# Patient Record
Sex: Male | Born: 2000 | Hispanic: Yes | Marital: Single | Smoking: Never smoker
Health system: Southern US, Community
[De-identification: ages and names within clinical notes are randomized; demographics above are authoritative.]

## PROBLEM LIST (undated history)

## (undated) DIAGNOSIS — Z8669 Personal history of other diseases of the nervous system and sense organs: Secondary | ICD-10-CM

## (undated) HISTORY — PX: MYRINGOTOMY WITH TUBE PLACEMENT: SHX5663

---
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
## 2021-02-04 IMAGING — DX DG FINGER INDEX 2+V*R*
3 series · 3 of 3 positions shown · non-contrast
Comparison: None.

CLINICAL DATA: Crush injury right index finger injury

EXAM:
RIGHT INDEX FINGER 2+V

[finger ap]
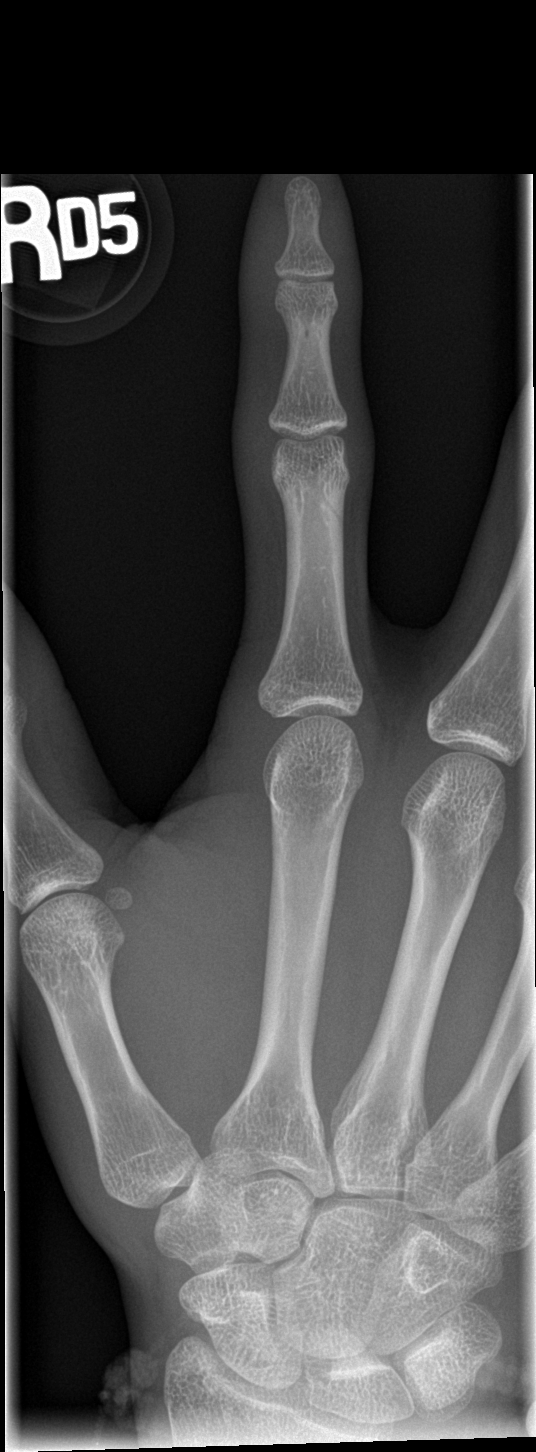

[finger obl]
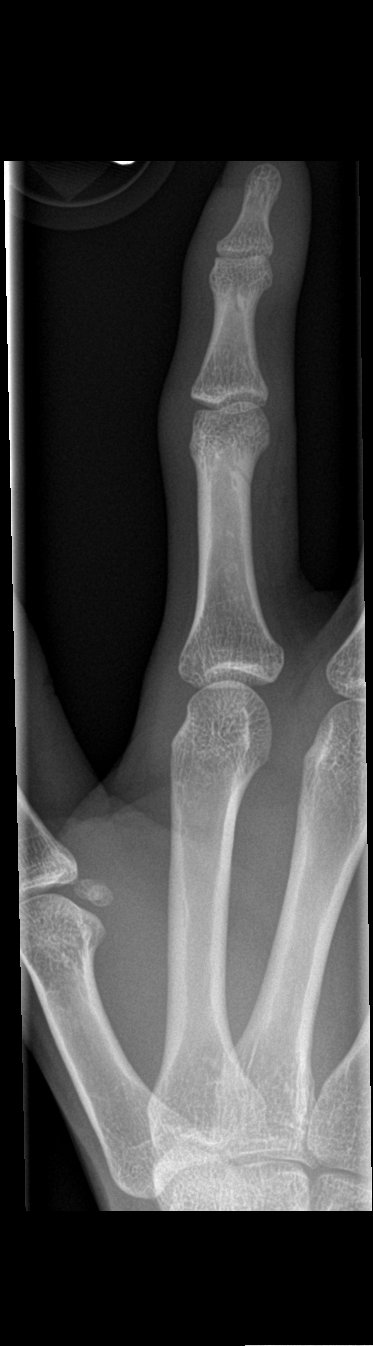

[finger lat]
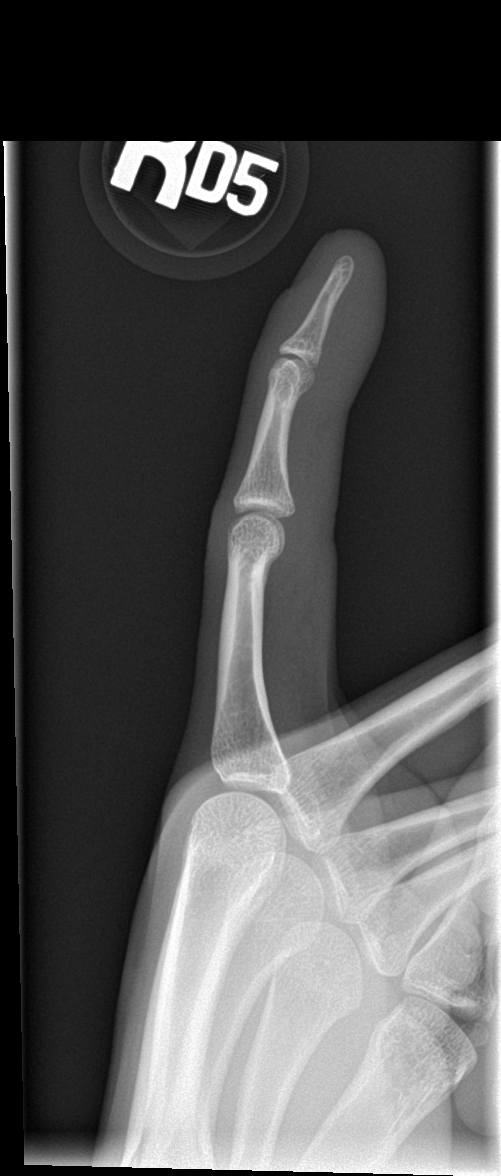

[3 of 3 positions shown; findings below may reference images not displayed]

FINDINGS: There is no evidence of fracture or dislocation. There is no
evidence of arthropathy or other focal bone abnormality. Soft
tissues are unremarkable.
IMPRESSION: Negative.

## 2022-03-13 ENCOUNTER — Encounter (HOSPITAL_COMMUNITY): Admission: EM | Disposition: A | Discharge status: Home / Self Care | Payer: Self-pay | Source: Home / Self Care

## 2022-03-13 ENCOUNTER — Inpatient Hospital Stay (HOSPITAL_COMMUNITY): Payer: Medicaid Other | Admitting: Anesthesiology

## 2022-03-13 ENCOUNTER — Emergency Department (HOSPITAL_COMMUNITY): Payer: Medicaid Other

## 2022-03-13 ENCOUNTER — Encounter (HOSPITAL_COMMUNITY): Payer: Self-pay | Admitting: General Surgery

## 2022-03-13 ENCOUNTER — Inpatient Hospital Stay (HOSPITAL_COMMUNITY): Payer: Medicaid Other

## 2022-03-13 ENCOUNTER — Other Ambulatory Visit: Payer: Self-pay

## 2022-03-13 ENCOUNTER — Inpatient Hospital Stay (HOSPITAL_COMMUNITY)
Admission: EM | Admit: 2022-03-13 | Discharge: 2022-03-16 | DRG: 481 | Disposition: A | Discharge status: Home / Self Care | Payer: Medicaid Other | Attending: Surgery | Admitting: Surgery

## 2022-03-13 DIAGNOSIS — S72341A Displaced spiral fracture of shaft of right femur, initial encounter for closed fracture: Principal | ICD-10-CM | POA: Diagnosis present

## 2022-03-13 DIAGNOSIS — M4854XA Collapsed vertebra, not elsewhere classified, thoracic region, initial encounter for fracture: Secondary | ICD-10-CM | POA: Diagnosis present

## 2022-03-13 DIAGNOSIS — Y92410 Unspecified street and highway as the place of occurrence of the external cause: Secondary | ICD-10-CM | POA: Diagnosis not present

## 2022-03-13 DIAGNOSIS — Z880 Allergy status to penicillin: Secondary | ICD-10-CM | POA: Diagnosis not present

## 2022-03-13 DIAGNOSIS — T1490XA Injury, unspecified, initial encounter: Secondary | ICD-10-CM | POA: Diagnosis present

## 2022-03-13 DIAGNOSIS — S72301A Unspecified fracture of shaft of right femur, initial encounter for closed fracture: Secondary | ICD-10-CM

## 2022-03-13 DIAGNOSIS — M4856XA Collapsed vertebra, not elsewhere classified, lumbar region, initial encounter for fracture: Secondary | ICD-10-CM | POA: Diagnosis present

## 2022-03-13 HISTORY — DX: Personal history of other diseases of the nervous system and sense organs: Z86.69

## 2022-03-13 HISTORY — PX: FEMUR IM NAIL: SHX1597

## 2022-03-13 LAB — CBC
HCT: 44.3 % (ref 39.0–52.0)
HCT: 43.8 % (ref 39.0–52.0)
Hemoglobin: 15.4 g/dL (ref 13.0–17.0)
Hemoglobin: 15.2 g/dL (ref 13.0–17.0)
MCH: 31.8 pg (ref 26.0–34.0)
MCH: 31.7 pg (ref 26.0–34.0)
MCHC: 34.8 g/dL (ref 30.0–36.0)
MCHC: 34.7 g/dL (ref 30.0–36.0)
MCV: 91.5 fL (ref 80.0–100.0)
MCV: 91.3 fL (ref 80.0–100.0)
Platelets: 189 10*3/uL (ref 150–400)
Platelets: 168 10*3/uL (ref 150–400)
RBC: 4.84 MIL/uL (ref 4.22–5.81)
RBC: 4.8 MIL/uL (ref 4.22–5.81)
RDW: 12.2 % (ref 11.5–15.5)
RDW: 12.2 % (ref 11.5–15.5)
WBC: 11.1 10*3/uL — ABNORMAL HIGH (ref 4.0–10.5)
WBC: 14.1 10*3/uL — ABNORMAL HIGH (ref 4.0–10.5)
nRBC: 0 % (ref 0.0–0.2)
nRBC: 0 % (ref 0.0–0.2)

## 2022-03-13 LAB — I-STAT CHEM 8, ED
BUN: 23 mg/dL — ABNORMAL HIGH (ref 6–20)
Calcium, Ion: 1.15 mmol/L (ref 1.15–1.40)
Chloride: 105 mmol/L (ref 98–111)
Creatinine, Ser: 0.9 mg/dL (ref 0.61–1.24)
Glucose, Bld: 127 mg/dL — ABNORMAL HIGH (ref 70–99)
HCT: 44 % (ref 39.0–52.0)
Hemoglobin: 15 g/dL (ref 13.0–17.0)
Potassium: 4.4 mmol/L (ref 3.5–5.1)
Sodium: 139 mmol/L (ref 135–145)
TCO2: 26 mmol/L (ref 22–32)

## 2022-03-13 LAB — COMPREHENSIVE METABOLIC PANEL
ALT: 26 U/L (ref 0–44)
AST: 56 U/L — ABNORMAL HIGH (ref 15–41)
Albumin: 4.3 g/dL (ref 3.5–5.0)
Alkaline Phosphatase: 79 U/L (ref 38–126)
Anion gap: 9 (ref 5–15)
BUN: 17 mg/dL (ref 6–20)
CO2: 23 mmol/L (ref 22–32)
Calcium: 8.9 mg/dL (ref 8.9–10.3)
Chloride: 106 mmol/L (ref 98–111)
Creatinine, Ser: 1.02 mg/dL (ref 0.61–1.24)
GFR, Estimated: >60 mL/min (ref >60)
Glucose, Bld: 133 mg/dL — ABNORMAL HIGH (ref 70–99)
Potassium: 4.2 mmol/L (ref 3.5–5.1)
Sodium: 138 mmol/L (ref 135–145)
Total Bilirubin: 0.7 mg/dL (ref 0.3–1.2)
Total Protein: 6.4 g/dL — ABNORMAL LOW (ref 6.5–8.1)

## 2022-03-13 LAB — CREATININE, SERUM
Creatinine, Ser: 0.92 mg/dL (ref 0.61–1.24)
GFR, Estimated: >60 mL/min (ref >60)

## 2022-03-13 LAB — ETHANOL: Alcohol, Ethyl (B): <10 mg/dL (ref <10)

## 2022-03-13 LAB — SAMPLE TO BLOOD BANK

## 2022-03-13 LAB — LACTIC ACID, PLASMA: Lactic Acid, Venous: 1.2 mmol/L (ref 0.5–1.9)

## 2022-03-13 LAB — HIV ANTIBODY (ROUTINE TESTING W REFLEX): HIV Screen 4th Generation wRfx: NONREACTIVE

## 2022-03-13 SURGERY — INSERTION, INTRAMEDULLARY ROD, FEMUR
Anesthesia: General | Laterality: Right

## 2022-03-13 MED ORDER — ONDANSETRON HCL 4 MG/2ML IJ SOLN
INTRAMUSCULAR | Status: AC
  Filled 2022-03-13: qty 2

## 2022-03-13 MED ORDER — METHOCARBAMOL 1000 MG/10ML IJ SOLN: 500.0000 mg | Freq: Three times a day (TID) | INTRAVENOUS | Status: DC | PRN

## 2022-03-13 MED ORDER — DOCUSATE SODIUM 100 MG PO CAPS
100.0000 mg | ORAL_CAPSULE | Freq: Two times a day (BID) | ORAL | Status: DC
  Administered 2022-03-13: 100 mg via ORAL
  Filled 2022-03-13: qty 1

## 2022-03-13 MED ORDER — KETAMINE HCL 10 MG/ML IJ SOLN
INTRAMUSCULAR | Status: DC | PRN
  Administered 2022-03-13: 25 mg via INTRAVENOUS

## 2022-03-13 MED ORDER — METOCLOPRAMIDE HCL 5 MG/ML IJ SOLN: 5.0000 mg | Freq: Three times a day (TID) | INTRAMUSCULAR | Status: DC | PRN

## 2022-03-13 MED ORDER — ONDANSETRON 4 MG PO TBDP: 4.0000 mg | ORAL_TABLET | Freq: Four times a day (QID) | ORAL | Status: DC | PRN

## 2022-03-13 MED ORDER — CEFAZOLIN SODIUM-DEXTROSE 2-4 GM/100ML-% IV SOLN
INTRAVENOUS | Status: AC
  Filled 2022-03-13: qty 100

## 2022-03-13 MED ORDER — PROPOFOL 10 MG/ML IV BOLUS
INTRAVENOUS | Status: AC
  Filled 2022-03-13: qty 20

## 2022-03-13 MED ORDER — IOHEXOL 350 MG/ML SOLN
75.0000 mL | Freq: Once | INTRAVENOUS | Status: AC | PRN
  Administered 2022-03-13: 75 mL via INTRAVENOUS

## 2022-03-13 MED ORDER — ONDANSETRON HCL 4 MG PO TABS: 4.0000 mg | ORAL_TABLET | Freq: Four times a day (QID) | ORAL | Status: DC | PRN

## 2022-03-13 MED ORDER — DEXAMETHASONE SODIUM PHOSPHATE 10 MG/ML IJ SOLN
INTRAMUSCULAR | Status: DC | PRN
  Administered 2022-03-13: 5 mg via INTRAVENOUS

## 2022-03-13 MED ORDER — SODIUM CHLORIDE 0.9 % IV SOLN: INTRAVENOUS | Status: DC

## 2022-03-13 MED ORDER — MORPHINE SULFATE (PF) 4 MG/ML IV SOLN
6.0000 mg | INTRAVENOUS | Status: DC | PRN
  Administered 2022-03-13: 6 mg via INTRAVENOUS
  Filled 2022-03-13: qty 2

## 2022-03-13 MED ORDER — MELATONIN 3 MG PO TABS
3.0000 mg | ORAL_TABLET | Freq: Every evening | ORAL | Status: DC | PRN
  Administered 2022-03-13 – 2022-03-14 (×2): 3 mg via ORAL
  Filled 2022-03-13 (×2): qty 1

## 2022-03-13 MED ORDER — FENTANYL CITRATE PF 50 MCG/ML IJ SOSY
100.0000 ug | PREFILLED_SYRINGE | INTRAMUSCULAR | Status: AC
  Administered 2022-03-13: 100 ug via INTRAVENOUS
  Filled 2022-03-13: qty 2

## 2022-03-13 MED ORDER — DOCUSATE SODIUM 100 MG PO CAPS
100.0000 mg | ORAL_CAPSULE | Freq: Two times a day (BID) | ORAL | Status: DC
  Administered 2022-03-13 – 2022-03-16 (×6): 100 mg via ORAL
  Filled 2022-03-13 (×6): qty 1

## 2022-03-13 MED ORDER — OXYCODONE HCL 5 MG PO TABS
5.0000 mg | ORAL_TABLET | ORAL | Status: DC | PRN
  Administered 2022-03-13 – 2022-03-14 (×4): 10 mg via ORAL
  Administered 2022-03-15 (×2): 5 mg via ORAL
  Administered 2022-03-15: 10 mg via ORAL
  Filled 2022-03-13 (×2): qty 2
  Filled 2022-03-13: qty 1
  Filled 2022-03-13 (×2): qty 2
  Filled 2022-03-13: qty 1
  Filled 2022-03-13 (×2): qty 2

## 2022-03-13 MED ORDER — ACETAMINOPHEN 500 MG PO TABS
1000.0000 mg | ORAL_TABLET | Freq: Four times a day (QID) | ORAL | Status: DC
  Administered 2022-03-13 – 2022-03-16 (×8): 1000 mg via ORAL
  Filled 2022-03-13 (×8): qty 2

## 2022-03-13 MED ORDER — LACTATED RINGERS IV SOLN: INTRAVENOUS | Status: DC

## 2022-03-13 MED ORDER — KETAMINE HCL 50 MG/5ML IJ SOSY
PREFILLED_SYRINGE | INTRAMUSCULAR | Status: AC
  Filled 2022-03-13: qty 5

## 2022-03-13 MED ORDER — POLYETHYLENE GLYCOL 3350 17 G PO PACK
17.0000 g | PACK | Freq: Every day | ORAL | Status: DC | PRN
  Administered 2022-03-15: 17 g via ORAL
  Filled 2022-03-13 (×2): qty 1

## 2022-03-13 MED ORDER — FENTANYL CITRATE (PF) 250 MCG/5ML IJ SOLN
INTRAMUSCULAR | Status: DC | PRN
  Administered 2022-03-13: 100 ug via INTRAVENOUS
  Administered 2022-03-13: 50 ug via INTRAVENOUS

## 2022-03-13 MED ORDER — ONDANSETRON HCL 4 MG/2ML IJ SOLN
4.0000 mg | Freq: Four times a day (QID) | INTRAMUSCULAR | Status: DC | PRN
  Administered 2022-03-13: 4 mg via INTRAVENOUS
  Filled 2022-03-13: qty 2

## 2022-03-13 MED ORDER — FENTANYL CITRATE (PF) 250 MCG/5ML IJ SOLN
INTRAMUSCULAR | Status: AC
  Filled 2022-03-13: qty 5

## 2022-03-13 MED ORDER — ORAL CARE MOUTH RINSE: 15.0000 mL | Freq: Once | OROMUCOSAL | Status: AC

## 2022-03-13 MED ORDER — CEFAZOLIN SODIUM-DEXTROSE 2-4 GM/100ML-% IV SOLN
2.0000 g | Freq: Three times a day (TID) | INTRAVENOUS | Status: AC
  Administered 2022-03-13 – 2022-03-14 (×3): 2 g via INTRAVENOUS
  Filled 2022-03-13 (×3): qty 100

## 2022-03-13 MED ORDER — MIDAZOLAM HCL 2 MG/2ML IJ SOLN
INTRAMUSCULAR | Status: DC | PRN
  Administered 2022-03-13: 2 mg via INTRAVENOUS

## 2022-03-13 MED ORDER — PHENYLEPHRINE 80 MCG/ML (10ML) SYRINGE FOR IV PUSH (FOR BLOOD PRESSURE SUPPORT)
PREFILLED_SYRINGE | INTRAVENOUS | Status: DC | PRN
  Administered 2022-03-13: 160 ug via INTRAVENOUS
  Administered 2022-03-13: 80 ug via INTRAVENOUS
  Administered 2022-03-13: 160 ug via INTRAVENOUS

## 2022-03-13 MED ORDER — ROCURONIUM BROMIDE 10 MG/ML (PF) SYRINGE
PREFILLED_SYRINGE | INTRAVENOUS | Status: DC | PRN
  Administered 2022-03-13: 60 mg via INTRAVENOUS

## 2022-03-13 MED ORDER — ACETAMINOPHEN 325 MG PO TABS: 650.0000 mg | ORAL_TABLET | Freq: Four times a day (QID) | ORAL | Status: DC | PRN

## 2022-03-13 MED ORDER — MIDAZOLAM HCL 2 MG/2ML IJ SOLN
INTRAMUSCULAR | Status: AC
  Filled 2022-03-13: qty 2

## 2022-03-13 MED ORDER — 0.9 % SODIUM CHLORIDE (POUR BTL) OPTIME
TOPICAL | Status: DC | PRN
  Administered 2022-03-13: 1000 mL

## 2022-03-13 MED ORDER — PHENYLEPHRINE HCL-NACL 20-0.9 MG/250ML-% IV SOLN
INTRAVENOUS | Status: DC | PRN
  Administered 2022-03-13: 20 ug/min via INTRAVENOUS

## 2022-03-13 MED ORDER — ONDANSETRON HCL 4 MG/2ML IJ SOLN
INTRAMUSCULAR | Status: DC | PRN
  Administered 2022-03-13: 4 mg via INTRAVENOUS

## 2022-03-13 MED ORDER — LACTATED RINGERS IV SOLN: INTRAVENOUS | Status: DC | PRN

## 2022-03-13 MED ORDER — TRANEXAMIC ACID-NACL 1000-0.7 MG/100ML-% IV SOLN
1000.0000 mg | Freq: Once | INTRAVENOUS | Status: AC
  Administered 2022-03-13: 1000 mg via INTRAVENOUS
  Filled 2022-03-13: qty 100

## 2022-03-13 MED ORDER — METOCLOPRAMIDE HCL 5 MG PO TABS: 5.0000 mg | ORAL_TABLET | Freq: Three times a day (TID) | ORAL | Status: DC | PRN

## 2022-03-13 MED ORDER — HYDROMORPHONE HCL 1 MG/ML IJ SOLN: 1.0000 mg | INTRAMUSCULAR | Status: DC | PRN

## 2022-03-13 MED ORDER — LIDOCAINE 2% (20 MG/ML) 5 ML SYRINGE
INTRAMUSCULAR | Status: DC | PRN
  Administered 2022-03-13: 60 mg via INTRAVENOUS

## 2022-03-13 MED ORDER — CHLORHEXIDINE GLUCONATE 0.12 % MT SOLN: 15.0000 mL | Freq: Once | OROMUCOSAL | Status: AC

## 2022-03-13 MED ORDER — METHOCARBAMOL 500 MG PO TABS
500.0000 mg | ORAL_TABLET | Freq: Three times a day (TID) | ORAL | Status: DC | PRN
  Administered 2022-03-13 – 2022-03-16 (×4): 500 mg via ORAL
  Filled 2022-03-13 (×4): qty 1

## 2022-03-13 MED ORDER — SUGAMMADEX SODIUM 200 MG/2ML IV SOLN
INTRAVENOUS | Status: DC | PRN
  Administered 2022-03-13: 150 mg via INTRAVENOUS

## 2022-03-13 MED ORDER — CHLORHEXIDINE GLUCONATE 0.12 % MT SOLN
OROMUCOSAL | Status: AC
  Administered 2022-03-13: 15 mL via OROMUCOSAL
  Filled 2022-03-13: qty 15

## 2022-03-13 MED ORDER — DEXTROSE 5 % IV SOLN: 2000.0000 mg | Freq: Once | INTRAVENOUS | Status: DC

## 2022-03-13 MED ORDER — CEFAZOLIN SODIUM-DEXTROSE 2-4 GM/100ML-% IV SOLN
2.0000 g | Freq: Once | INTRAVENOUS | Status: AC
  Administered 2022-03-13: 2 g via INTRAVENOUS

## 2022-03-13 MED ORDER — ENOXAPARIN SODIUM 30 MG/0.3ML IJ SOSY
30.0000 mg | PREFILLED_SYRINGE | Freq: Two times a day (BID) | INTRAMUSCULAR | Status: DC
  Administered 2022-03-14 – 2022-03-16 (×5): 30 mg via SUBCUTANEOUS
  Filled 2022-03-13 (×5): qty 0.3

## 2022-03-13 MED ORDER — PROPOFOL 10 MG/ML IV BOLUS
INTRAVENOUS | Status: DC | PRN
  Administered 2022-03-13: 200 mg via INTRAVENOUS

## 2022-03-13 MED ORDER — METOPROLOL TARTRATE 5 MG/5ML IV SOLN: 5.0000 mg | Freq: Four times a day (QID) | INTRAVENOUS | Status: DC | PRN

## 2022-03-13 MED ORDER — ONDANSETRON HCL 4 MG/2ML IJ SOLN: 4.0000 mg | Freq: Four times a day (QID) | INTRAMUSCULAR | Status: DC | PRN

## 2022-03-13 SURGICAL SUPPLY — 57 items
BAG COUNTER SPONGE SURGICOUNT (BAG) ×1 IMPLANT
BIT DRILL CANN FLEX 14 (BIT) IMPLANT
BIT DRILL LONG 4.2 (BIT) IMPLANT
BIT DRILL SHORT 4.2 (BIT) IMPLANT
BLADE SURG 10 STRL SS (BLADE) ×2 IMPLANT
BNDG COHESIVE 4X5 TAN STRL (GAUZE/BANDAGES/DRESSINGS) ×1 IMPLANT
BNDG COHESIVE 6X5 TAN ST LF (GAUZE/BANDAGES/DRESSINGS) IMPLANT
BNDG ELASTIC 4X5.8 VLCR STR LF (GAUZE/BANDAGES/DRESSINGS) ×1 IMPLANT
BNDG ELASTIC 6X5.8 VLCR STR LF (GAUZE/BANDAGES/DRESSINGS) ×1 IMPLANT
BRUSH SCRUB EZ PLAIN DRY (MISCELLANEOUS) ×2 IMPLANT
CHLORAPREP W/TINT 26 (MISCELLANEOUS) ×1 IMPLANT
COVER SURGICAL LIGHT HANDLE (MISCELLANEOUS) ×1 IMPLANT
DRAPE C-ARM 35X43 STRL (DRAPES) ×1 IMPLANT
DRAPE C-ARMOR (DRAPES) ×1 IMPLANT
DRAPE HALF SHEET 40X57 (DRAPES) ×2 IMPLANT
DRAPE IMP U-DRAPE 54X76 (DRAPES) ×2 IMPLANT
DRAPE INCISE IOBAN 66X45 STRL (DRAPES) ×1 IMPLANT
DRAPE ORTHO SPLIT 77X108 STRL (DRAPES)
DRAPE SURG 17X23 STRL (DRAPES) ×1 IMPLANT
DRAPE SURG ORHT 6 SPLT 77X108 (DRAPES) ×2 IMPLANT
DRAPE U-SHAPE 47X51 STRL (DRAPES) ×1 IMPLANT
DRESSING MEPILEX FLEX 4X4 (GAUZE/BANDAGES/DRESSINGS) IMPLANT
DRILL BIT SHORT 4.2 (BIT) ×2
DRSG MEPILEX FLEX 4X4 (GAUZE/BANDAGES/DRESSINGS) ×1
DRSG MEPILEX POST OP 4X8 (GAUZE/BANDAGES/DRESSINGS) ×1 IMPLANT
ELECT REM PT RETURN 9FT ADLT (ELECTROSURGICAL) ×1
ELECTRODE REM PT RTRN 9FT ADLT (ELECTROSURGICAL) ×1 IMPLANT
GLOVE BIO SURGEON STRL SZ 6.5 (GLOVE) ×3 IMPLANT
GLOVE BIO SURGEON STRL SZ7.5 (GLOVE) ×3 IMPLANT
GLOVE BIOGEL PI IND STRL 6.5 (GLOVE) ×1 IMPLANT
GLOVE BIOGEL PI IND STRL 7.5 (GLOVE) ×1 IMPLANT
GOWN STRL REUS W/ TWL LRG LVL3 (GOWN DISPOSABLE) ×3 IMPLANT
GOWN STRL REUS W/ TWL XL LVL3 (GOWN DISPOSABLE) ×1 IMPLANT
GOWN STRL REUS W/TWL LRG LVL3 (GOWN DISPOSABLE) ×3
GOWN STRL REUS W/TWL XL LVL3 (GOWN DISPOSABLE) ×1
GUIDEWIRE 3.2X400 (WIRE) IMPLANT
KIT BASIN OR (CUSTOM PROCEDURE TRAY) ×1 IMPLANT
KIT TURNOVER KIT B (KITS) ×1 IMPLANT
MANIFOLD NEPTUNE II (INSTRUMENTS) ×1 IMPLANT
NAIL CANN FRN TI 9X340 RT (Nail) IMPLANT
NS IRRIG 1000ML POUR BTL (IV SOLUTION) ×1 IMPLANT
PACK GENERAL/GYN (CUSTOM PROCEDURE TRAY) ×1 IMPLANT
PAD ARMBOARD 7.5X6 YLW CONV (MISCELLANEOUS) ×2 IMPLANT
REAMER ROD DEEP FLUTE 2.5X950 (INSTRUMENTS) IMPLANT
SCREW LOCK IM 5X60 (Screw) IMPLANT
SCREW LOCK IM NAIL 5X34 (Screw) IMPLANT
SCREW LOCK IM TI 5X32 STRL (Screw) IMPLANT
STAPLER VISISTAT 35W (STAPLE) ×1 IMPLANT
STOCKINETTE IMPERVIOUS LG (DRAPES) ×1 IMPLANT
SUT VIC AB 0 CT1 27 (SUTURE) ×1
SUT VIC AB 0 CT1 27XBRD ANBCTR (SUTURE) IMPLANT
SUT VIC AB 2-0 CT1 27 (SUTURE) ×2
SUT VIC AB 2-0 CT1 TAPERPNT 27 (SUTURE) IMPLANT
TOWEL GREEN STERILE (TOWEL DISPOSABLE) ×2 IMPLANT
TOWEL GREEN STERILE FF (TOWEL DISPOSABLE) ×1 IMPLANT
UNDERPAD 30X36 HEAVY ABSORB (UNDERPADS AND DIAPERS) ×1 IMPLANT
WATER STERILE IRR 1000ML POUR (IV SOLUTION) ×1 IMPLANT
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
## 2022-03-13 NOTE — Interval H&P Note (Signed)
History and Physical Interval Note:  03/13/2022 12:35 PM  Paul Mcfarland  has presented today for surgery, with the diagnosis of Right femur fracture.  The various methods of treatment have been discussed with the patient and family. After consideration of risks, benefits and other options for treatment, the patient has consented to  Procedure(s): INTRAMEDULLARY (IM) NAIL FEMORAL (Right) as a surgical intervention.  The patient's history has been reviewed, patient examined, no change in status, stable for surgery.  I have reviewed the patient's chart and labs.  Questions were answered to the patient's satisfaction.     Lennette Bihari P Juan Kissoon
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
## 2022-03-13 NOTE — Anesthesia Preprocedure Evaluation (Addendum)
Anesthesia Evaluation  Patient identified by MRN, date of birth, ID band Patient awake    Reviewed: Allergy & Precautions, NPO status , Patient's Chart, lab work & pertinent test results  History of Anesthesia Complications Negative for: history of anesthetic complications  Airway Mallampati: II  TM Distance: >3 FB Neck ROM: Full    Dental  (+) Dental Advisory Given   Pulmonary neg pulmonary ROS   Pulmonary exam normal breath sounds clear to auscultation       Cardiovascular negative cardio ROS  Rhythm:Regular Rate:Normal     Neuro/Psych negative neurological ROS     GI/Hepatic negative GI ROS, Neg liver ROS,,,  Endo/Other  negative endocrine ROS    Renal/GU negative Renal ROS     Musculoskeletal   Abdominal   Peds  Hematology negative hematology ROS (+)   Anesthesia Other Findings   Reproductive/Obstetrics                             Anesthesia Physical Anesthesia Plan  ASA: 1  Anesthesia Plan: General   Post-op Pain Management: Tylenol PO (pre-op)*   Induction: Intravenous  PONV Risk Score and Plan: 2 and Ondansetron, Dexamethasone and Treatment may vary due to age or medical condition  Airway Management Planned: Oral ETT  Additional Equipment:   Intra-op Plan:   Post-operative Plan: Extubation in OR  Informed Consent: I have reviewed the patients History and Physical, chart, labs and discussed the procedure including the risks, benefits and alternatives for the proposed anesthesia with the patient or authorized representative who has indicated his/her understanding and acceptance.     Dental advisory given  Plan Discussed with: CRNA and Anesthesiologist  Anesthesia Plan Comments: (Risks of general anesthesia discussed including, but not limited to, sore throat, hoarse voice, chipped/damaged teeth, injury to vocal cords, nausea and vomiting, allergic reactions, lung  infection, heart attack, stroke, and death. All questions answered. )        Anesthesia Quick Evaluation

## 2022-03-13 NOTE — Transfer of Care (Signed)
Immediate Anesthesia Transfer of Care Note  Patient: Paul Mcfarland  Procedure(s) Performed: INTRAMEDULLARY (IM) NAIL FEMORAL (Right)  Patient Location: PACU  Anesthesia Type:General  Level of Consciousness: awake and alert   Airway & Oxygen Therapy: Patient Spontanous Breathing and Patient connected to nasal cannula oxygen  Post-op Assessment: Report given to RN and Post -op Vital signs reviewed and stable  Post vital signs: Reviewed and stable  Last Vitals:  Vitals Value Taken Time  BP 114/96 03/13/22 1445  Temp    Pulse 119 03/13/22 1446  Resp 17 03/13/22 1446  SpO2 93 % 03/13/22 1446  Vitals shown include unvalidated device data.  Last Pain:  Vitals:   03/13/22 1252  TempSrc: Oral  PainSc: 10-Worst pain ever         Complications: No notable events documented.
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
## 2022-03-13 NOTE — Anesthesia Procedure Notes (Signed)
Procedure Name: Intubation Date/Time: 03/13/2022 1:24 PM  Performed by: Inda Coke, CRNAPre-anesthesia Checklist: Patient identified, Emergency Drugs available, Suction available, Timeout performed and Patient being monitored Patient Re-evaluated:Patient Re-evaluated prior to induction Oxygen Delivery Method: Circle system utilized Preoxygenation: Pre-oxygenation with 100% oxygen Induction Type: IV induction Ventilation: Mask ventilation without difficulty Laryngoscope Size: Mac and 4 Grade View: Grade I Tube type: Oral Tube size: 7.5 mm Number of attempts: 1 Airway Equipment and Method: Stylet Placement Confirmation: ETT inserted through vocal cords under direct vision, positive ETCO2, CO2 detector and breath sounds checked- equal and bilateral Secured at: 22 cm Tube secured with: Tape Dental Injury: Teeth and Oropharynx as per pre-operative assessment
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
## 2022-03-13 NOTE — Progress Notes (Signed)
Pt admitted to 6N room 21, A and O x 4, breathing on RA.
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
## 2022-03-13 NOTE — Anesthesia Postprocedure Evaluation (Addendum)
Anesthesia Post Note  Patient: Damerius Werthmann  Procedure(s) Performed: INTRAMEDULLARY (IM) NAIL FEMORAL (Right)     Patient location during evaluation: PACU Anesthesia Type: General Level of consciousness: awake Pain management: pain level controlled Vital Signs Assessment: post-procedure vital signs reviewed and stable Respiratory status: spontaneous breathing, nonlabored ventilation and respiratory function stable Cardiovascular status: blood pressure returned to baseline and stable Postop Assessment: no apparent nausea or vomiting Anesthetic complications: no   No notable events documented.  Last Vitals:  Vitals:   03/13/22 1500 03/13/22 1515  BP: 123/65 119/72  Pulse: 99 98  Resp: 15 14  Temp:  37.1 C  SpO2: 100% 94%    Last Pain:  Vitals:   03/13/22 1445  TempSrc:   PainSc: Asleep                 Nilda Simmer
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
## 2022-03-14 LAB — BASIC METABOLIC PANEL
Anion gap: 4 — ABNORMAL LOW (ref 5–15)
BUN: 10 mg/dL (ref 6–20)
CO2: 25 mmol/L (ref 22–32)
Calcium: 8.1 mg/dL — ABNORMAL LOW (ref 8.9–10.3)
Chloride: 105 mmol/L (ref 98–111)
Creatinine, Ser: 0.97 mg/dL (ref 0.61–1.24)
GFR, Estimated: >60 mL/min (ref >60)
Glucose, Bld: 126 mg/dL — ABNORMAL HIGH (ref 70–99)
Potassium: 3.8 mmol/L (ref 3.5–5.1)
Sodium: 134 mmol/L — ABNORMAL LOW (ref 135–145)

## 2022-03-14 LAB — CBC
HCT: 34.7 % — ABNORMAL LOW (ref 39.0–52.0)
Hemoglobin: 11.7 g/dL — ABNORMAL LOW (ref 13.0–17.0)
MCH: 31.2 pg (ref 26.0–34.0)
MCHC: 33.7 g/dL (ref 30.0–36.0)
MCV: 92.5 fL (ref 80.0–100.0)
Platelets: 133 10*3/uL — ABNORMAL LOW (ref 150–400)
RBC: 3.75 MIL/uL — ABNORMAL LOW (ref 4.22–5.81)
RDW: 12.2 % (ref 11.5–15.5)
WBC: 8.7 10*3/uL (ref 4.0–10.5)
nRBC: 0 % (ref 0.0–0.2)
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
## 2022-03-14 NOTE — TOC CAGE-AID Note (Signed)
Transition of Care [facility]) - CAGE-AID Screening   Patient Details  Name: Paul Mcfarland MRN: [id number] Date of Birth: March 21, 2000  [facility]) CM/SW Contact:    Clovis Cao, RN Phone Number: [phone] 03/14/2022, 4:29 PM   Clinical Narrative: Pt denies alcohol or drug abuse. No resources needed.  Screening complete.   CAGE-AID Screening:    Have You Ever Felt You Ought to Cut Down on Your Drinking or Drug Use?: No Have People Annoyed You By Critizing Your Drinking Or Drug Use?: No Have You Felt Bad Or Guilty About Your Drinking Or Drug Use?: No Have You Ever Had a Drink or Used Drugs First Thing In The Morning to Steady Your Nerves or to Get Rid of a Hangover?: No CAGE-AID Score: 0  Substance Abuse Education Offered: No

## 2022-03-14 NOTE — Progress Notes (Signed)
ORTHOPAEDIC PROGRESS NOTE  s/p Procedure(s): INTRAMEDULLARY (IM) NAIL FEMORAL  SUBJECTIVE: Reports moderate pain about operative site.   No chest pain. No SOB. No nausea/vomiting. No other complaints.  OBJECTIVE: PE: General: sitting up in hospital bed, NAD RLE: dressing CDI, intact EHL/TA/GSC, endorses distal sensation, warm well perfused foot   Vitals:   03/14/22 0540 03/14/22 0909  BP: (!) 107/55 123/73  Pulse: 84 (!) 106  Resp: 16 16  Temp: 97.9 F (36.6 C) 97.8 F (36.6 C)  SpO2: 98% 96%   Stable post-op images  ASSESSMENT: Unknown Manganelli is a 22 y.o. male POD#1  PLAN: Weightbearing: WBAT RLE Insicional and dressing care: Reinforce dressings as needed Orthopedic device(s): None Showering: Hold for now VTE prophylaxis: Lovenox while inpatient. Will be discharged on '81mg'$  Aspirin Pain control: PRN pain medications, minimize narcotics as able Dispo: TBD. PT eval pending. OT recommending home health.  Follow - up plan: 2 weeks in office with Dr. Suella Grove information:  After hours and holidays please check [email or web address] for group call information for [facility]    Noemi Chapel, PA-C 03/14/2022
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
## 2022-03-15 NOTE — Progress Notes (Signed)
ORTHOPAEDIC PROGRESS NOTE  s/p Procedure(s): INTRAMEDULLARY (IM) NAIL FEMORAL  SUBJECTIVE: Reports moderate pain about operative site.   No chest pain. No SOB. No nausea/vomiting. No other complaints.  OBJECTIVE: PE: General: sitting up in hospital bed, NAD RLE: dressing CDI, intact EHL/TA/GSC, endorses distal sensation, warm well perfused foot   Vitals:   03/15/22 0452 03/15/22 0746  BP: 114/61 117/70  Pulse: 82 92  Resp: 15 17  Temp: 98 F (36.7 C) 98.3 F (36.8 C)  SpO2: 98% 98%   Stable post-op images  ASSESSMENT: Paul Mcfarland is a 22 y.o. male POD#2  PLAN: Weightbearing: TDWB RLE Insicional and dressing care: Reinforce dressings as needed Orthopedic device(s): None Showering: Hold for now VTE prophylaxis: Lovenox while inpatient. Will be discharged on '81mg'$  Aspirin Pain control: PRN pain medications, minimize narcotics as able Dispo: PT recommending outpatient PT. OT recommending home health. Okay to discharge from ortho standpoint once cleared by trauma team.   Follow - up plan: 2 weeks in office with Dr. Suella Grove information:  After hours and holidays please check [email or web address] for group call information for [facility]    Noemi Chapel, PA-C 03/15/2022

## 2022-03-15 NOTE — Plan of Care (Signed)
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
## 2022-03-16 ENCOUNTER — Other Ambulatory Visit (HOSPITAL_COMMUNITY): Payer: Self-pay

## 2022-03-16 LAB — VITAMIN D 25 HYDROXY (VIT D DEFICIENCY, FRACTURES): Vit D, 25-Hydroxy: 32.21 ng/mL (ref 30–100)

## 2022-03-16 MED ORDER — POLYETHYLENE GLYCOL 3350 17 G PO PACK: 17.0000 g | PACK | Freq: Two times a day (BID) | ORAL | Status: DC

## 2022-03-16 MED ORDER — POLYETHYLENE GLYCOL 3350 17 G PO PACK: 17.0000 g | PACK | Freq: Two times a day (BID) | ORAL | 0 refills | Status: AC | PRN

## 2022-03-16 MED ORDER — DOCUSATE SODIUM 100 MG PO CAPS: 100.0000 mg | ORAL_CAPSULE | Freq: Two times a day (BID) | ORAL | 0 refills | Status: AC | PRN

## 2022-03-16 MED ORDER — ASPIRIN 81 MG PO TBEC
81.0000 mg | DELAYED_RELEASE_TABLET | Freq: Every day | ORAL | 0 refills | Status: AC
  Filled 2022-03-16: qty 30, 30d supply, fill #0

## 2022-03-16 MED ORDER — OXYCODONE HCL 5 MG PO TABS
5.0000 mg | ORAL_TABLET | Freq: Four times a day (QID) | ORAL | 0 refills | Status: AC | PRN
  Filled 2022-03-16: qty 20, 5d supply, fill #0

## 2022-03-16 MED ORDER — ACETAMINOPHEN 500 MG PO TABS: 1000.0000 mg | ORAL_TABLET | Freq: Three times a day (TID) | ORAL | 0 refills | Status: AC | PRN

## 2022-03-16 MED ORDER — METHOCARBAMOL 500 MG PO TABS
500.0000 mg | ORAL_TABLET | Freq: Three times a day (TID) | ORAL | 0 refills | Status: AC | PRN
  Filled 2022-03-16: qty 60, 20d supply, fill #0

## 2022-03-16 NOTE — TOC Transition Note (Signed)
Transition of Care [facility]) - CM/SW Discharge Note   Patient Details  Name: Paul Mcfarland MRN: [id number] Date of Birth: 04-Oct-2000  [facility]) CM/SW Contact:  Ella Bodo, RN Phone Number: 03/16/2022, 11:50am  Clinical Narrative:    Patient met stable for discharge home today with family to provide needed assistance.  PT/OT recommending outpatient follow-up, and patient is agreeable to services.  Referral to [facility] for continued PT/OT.  Referral to [facility] for recommended DME; tub bench, bedside commode, and rolling walker to be delivered to bedside prior to discharge.   Final next level of care: OP Rehab Barriers to Discharge: Barriers Resolved                       Discharge Plan and Services Additional resources added to the After Visit Summary for  N/A   Discharge Planning Services: CM Consult            DME Arranged: Tub bench, Walker rolling, Bedside commode DME Agency: [employer] Date DME Agency Contacted: 03/16/22 Time DME Agency Contacted: 1212 Representative spoke with at DME Agency: [place] ([place]) Interventions SDOH Screenings   Food Insecurity: No Food Insecurity (03/13/2022)  Housing: Low Risk  (03/13/2022)  Transportation Needs: No Transportation Needs (03/13/2022)  Utilities: Not At Risk (03/13/2022)  Tobacco Use: Low Risk  (03/13/2022)     Readmission Risk Interventions     No data to display         Reinaldo Raddle, RN, BSN  Trauma/Neuro ICU Case Manager [phone]
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
## 2022-03-16 NOTE — TOC CM/SW Note (Cosign Needed)
Patient is confined to one room and is unable to ambulate to the bathroom, therefore needing a commode at the bedside.    Madsen Riddle W. Adaeze Better, RN, BSN  Trauma/Neuro ICU Case Manager [phone]  
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
## 2022-03-16 NOTE — Progress Notes (Signed)
Patient being discharged home, self care. PIV removed. DME delivered to bedside. TOC meds received and given to pt. AVS discussed, pt verbalized understanding. Patient advised that he may shower and that no dressing changes are required. Patient left the unit in NAD with all belongings and medications.

## 2022-03-16 NOTE — Plan of Care (Signed)

## 2022-03-16 NOTE — Discharge Instructions (Signed)
Orthopaedic Trauma Service Discharge Instructions   General Discharge Instructions  WEIGHT BEARING STATUS:Touchdown weightbearing right leg  RANGE OF MOTION/ACTIVITY: ok for hip and knee range of motion as tolerated  Wound Care: You may remove your surgical dressings. Incisions can be left open to air if there is no drainage. Once the incision is completely dry and without drainage, it may be left open to air out.  Showering may begin on post-op day #3 (Monday 03/16/22).  Clean incision gently with soap and water.  DVT/PE prophylaxis: Aspirin 325 mg daily x30 days  Diet: as you were eating previously.  Can use over the counter stool softeners and bowel preparations, such as Miralax, to help with bowel movements.  Narcotics can be constipating.  Be sure to drink plenty of fluids  PAIN MEDICATION USE AND EXPECTATIONS  You have likely been given narcotic medications to help control your pain.  After a traumatic event that results in an fracture (broken bone) with or without surgery, it is ok to use narcotic pain medications to help control one's pain.  We understand that everyone responds to pain differently and each individual patient will be evaluated on a regular basis for the continued need for narcotic medications. Ideally, narcotic medication use should last no more than 6-8 weeks (coinciding with fracture healing).   As a patient it is your responsibility as well to monitor narcotic medication use and report the amount and frequency you use these medications when you come to your office visit.   We would also advise that if you are using narcotic medications, you should take a dose prior to therapy to maximize you participation.  IF YOU ARE ON NARCOTIC MEDICATIONS IT IS NOT PERMISSIBLE TO OPERATE A MOTOR VEHICLE (MOTORCYCLE/CAR/TRUCK/MOPED) OR HEAVY MACHINERY DO NOT MIX NARCOTICS WITH OTHER CNS (CENTRAL NERVOUS SYSTEM) DEPRESSANTS SUCH AS ALCOHOL   STOP SMOKING OR USING NICOTINE  PRODUCTS!!!!  As discussed nicotine severely impairs your body's ability to heal surgical and traumatic wounds but also impairs bone healing.  Wounds and bone heal by forming microscopic blood vessels (angiogenesis) and nicotine is a vasoconstrictor (essentially, shrinks blood vessels).  Therefore, if vasoconstriction occurs to these microscopic blood vessels they essentially disappear and are unable to deliver necessary nutrients to the healing tissue.  This is one modifiable factor that you can do to dramatically increase your chances of healing your injury.    (This means no smoking, no nicotine gum, patches, etc)  DO NOT USE NONSTEROIDAL ANTI-INFLAMMATORY DRUGS (NSAID'S)  Using products such as Advil (ibuprofen), Aleve (naproxen), Motrin (ibuprofen) for additional pain control during fracture healing can delay and/or prevent the healing response.  If you would like to take over the counter (OTC) medication, Tylenol (acetaminophen) is ok.  However, some narcotic medications that are given for pain control contain acetaminophen as well. Therefore, you should not exceed more than 4000 mg of tylenol in a day if you do not have liver disease.  Also note that there are may OTC medicines, such as cold medicines and allergy medicines that my contain tylenol as well.  If you have any questions about medications and/or interactions please ask your doctor/PA or your pharmacist.      ICE AND ELEVATE INJURED/OPERATIVE EXTREMITY  Using ice and elevating the injured extremity above your heart can help with swelling and pain control.  Icing in a pulsatile fashion, such as 20 minutes on and 20 minutes off, can be followed.    Do not place ice directly  on skin. Make sure there is a barrier between to skin and the ice pack.    Using frozen items such as frozen peas works well as the conform nicely to the are that needs to be iced.  USE AN ACE WRAP OR TED HOSE FOR SWELLING CONTROL  In addition to icing and elevation,  Ace wraps or TED hose are used to help limit and resolve swelling.  It is recommended to use Ace wraps or TED hose until you are informed to stop.    When using Ace Wraps start the wrapping distally (farthest away from the body) and wrap proximally (closer to the body)   Example: If you had surgery on your leg or thing and you do not have a splint on, start the ace wrap at the toes and work your way up to the thigh        If you had surgery on your upper extremity and do not have a splint on, start the ace wrap at your fingers and work your way up to the upper arm   [place] REFILLS OR WITH ANY QUESTIONS/CONCERNS: [phone]   VISIT OUR WEBSITE FOR ADDITIONAL INFORMATION: [email or web address]      Discharge Wound Care Instructions  Do NOT apply any ointments, solutions or lotions to pin sites or surgical wounds.  These prevent needed drainage and even though solutions like hydrogen peroxide kill bacteria, they also damage cells lining the pin sites that help fight infection.  Applying lotions or ointments can keep the wounds moist and can cause them to breakdown and open up as well. This can increase the risk for infection. When in doubt call the office.  Surgical incisions should be dressed daily.  If any drainage is noted, use a foam dressing (mepilex) - These dressing supplies should be available at local medical supply stores [facility], [facility], etc) as well as Management consultant ([employer], [facility], [employer], etc)  Once the incision is completely dry and without drainage, it may be left open to air out.  Showering may begin 36-48 hours later.  Cleaning gently with soap and water.

## 2022-03-16 NOTE — Progress Notes (Signed)
Occupational Therapy Treatment Patient Details Name: Paul Mcfarland MRN: [id number] DOB: [birth year] Today's Date: 03/16/2022   History of present illness Pt is 22 yo male presenting to [facility] ER due to [place] on 03/13/22 with T3 and L1 compression fractures and R femur fx.  Pt is s/p IMN of the R femur on 03/13/22. Per neurosurgery no sx for compression fx -recommended TLSO. No significant PMH.   OT comments  Pt is progressing well toward his OT goals and able to demonstrate supervision level assist with the RW today (also working on crutches with PT). 10 ft functional mobility with (S) overall. Discussed need for use of TTB at home and provided edu on purchasing one. HHOT will be beneficial to ensure carryover of education and reduce fall risk at home.    Recommendations for follow up therapy are one component of a multi-disciplinary discharge planning process, led by the attending physician.  Recommendations may be updated based on patient status, additional functional criteria and insurance authorization.    Follow Up Recommendations  Home health OT     Assistance Recommended at Discharge Intermittent Supervision/Assistance  Patient can return home with the following  A little help with walking and/or transfers;Assistance with cooking/housework;Help with stairs or ramp for entrance;A little help with bathing/dressing/bathroom   Equipment Recommendations  BSC/3in1;Tub/shower bench    Recommendations for Other Services      Precautions / Restrictions Precautions Precautions: Fall;Other (comment);Back Precaution Booklet Issued: No Precaution Comments: TLSO on for comfort, TTWB on RLE Required Braces or Orthoses: Spinal Brace Spinal Brace: Thoracolumbosacral orthotic;Applied in sitting position Restrictions Weight Bearing Restrictions: Yes RLE Weight Bearing: Touchdown weight bearing       Mobility Bed Mobility Overal bed mobility: Needs Assistance Bed Mobility: Supine to Sit, Sit  to Supine Rolling: Min assist Sidelying to sit: Min assist       General bed mobility comments: Good carryover of log rolling technique for back precaution adherence- min A overall    Transfers Overall transfer level: Needs assistance Equipment used: Rolling walker (2 wheels) Transfers: Sit to/from Stand Sit to Stand: Supervision                 Balance Overall balance assessment: Needs assistance Sitting-balance support: No upper extremity supported, Feet supported Sitting balance-Leahy Scale: Normal     Standing balance support: Single extremity supported, Bilateral upper extremity supported Standing balance-Leahy Scale: Fair Standing balance comment: Reliant on at least single UE support but also having to balance on 1 leg                           ADL either performed or assessed with clinical judgement   ADL Overall ADL's : Needs assistance/impaired                     Lower Body Dressing: Moderate assistance;Sit to/from stand               Functional mobility during ADLs: Supervision/safety;Rolling walker (2 wheels) General ADL Comments: Supervision with the RW- great RW management and adherence to TDWB precautions      Cognition Arousal/Alertness: Awake/alert Behavior During Therapy: WFL for tasks assessed/performed Overall Cognitive Status: Within Functional Limits for tasks assessed  Frequency  Min 3X/week        Progress Toward Goals  OT Goals(current goals can now be found in the care plan section)  Progress towards OT goals: Progressing toward goals  Acute Rehab OT Goals OT Goal Formulation: With patient/family Time For Goal Achievement: 03/28/22 Potential to Achieve Goals: Good  Plan Discharge plan remains appropriate    Co-evaluation                 AM-PAC OT "6 Clicks" Daily Activity     Outcome Measure   Help from another person  eating meals?: Total Help from another person taking care of personal grooming?: None Help from another person toileting, which includes using toliet, bedpan, or urinal?: A Little Help from another person bathing (including washing, rinsing, drying)?: A Little Help from another person to put on and taking off regular upper body clothing?: A Little Help from another person to put on and taking off regular lower body clothing?: A Lot 6 Click Score: 16    End of Session Equipment Utilized During Treatment: Gait belt;Rolling walker (2 wheels);Back brace  OT Visit Diagnosis: Unsteadiness on feet (R26.81);Other abnormalities of gait and mobility (R26.89);Muscle weakness (generalized) (M62.81);Pain Pain - Right/Left: Right Pain - part of body: Hip   Activity Tolerance Patient tolerated treatment well   Patient Left in chair;with call bell/phone within reach;with family/visitor present   Nurse Communication Mobility status        Time: [id number] OT Time Calculation (min): 13 min  Charges: OT General Charges $OT Visit: 1 Visit OT Treatments $Therapeutic Activity: 8-22 mins  Laverle Hobby, OTR/L, CBIS Acute Rehab Office: [place] 03/16/2022, 12:04 PM

## 2022-03-19 ENCOUNTER — Encounter (HOSPITAL_COMMUNITY): Payer: Self-pay | Admitting: Student

## 2022-03-23 ENCOUNTER — Ambulatory Visit: Payer: Medicaid Other | Attending: Physician Assistant | Admitting: Occupational Therapy

## 2022-03-23 DIAGNOSIS — Z5189 Encounter for other specified aftercare: Secondary | ICD-10-CM | POA: Insufficient documentation

## 2022-03-23 DIAGNOSIS — M6281 Muscle weakness (generalized): Secondary | ICD-10-CM | POA: Diagnosis present

## 2022-03-23 DIAGNOSIS — R2689 Other abnormalities of gait and mobility: Secondary | ICD-10-CM | POA: Insufficient documentation

## 2022-03-23 DIAGNOSIS — M4856XA Collapsed vertebra, not elsewhere classified, lumbar region, initial encounter for fracture: Secondary | ICD-10-CM | POA: Insufficient documentation

## 2022-03-23 DIAGNOSIS — R2681 Unsteadiness on feet: Secondary | ICD-10-CM | POA: Diagnosis present

## 2022-03-23 NOTE — Therapy (Signed)
OUTPATIENT OCCUPATIONAL THERAPY EVALUATION  Patient Name: Paul Mcfarland MRN: [id number] DOB:Feb 05, 2000, 22 y.o., male Today's Date: 03/23/2022  PCP: [place] PROVIDER: Alferd Apa, PA-C  END OF SESSION:  OT End of Session - 03/23/22 1626     Visit Number 1    Number of Visits 12    Date for OT Re-Evaluation 06/15/22    Authorization Type progress reporting period 03/23/2022    OT Start Time 1403    OT Stop Time 1453    OT Time Calculation (min) 50 min    Activity Tolerance Patient tolerated treatment well    Behavior During Therapy [facility] for tasks assessed/performed             Past Medical History:  Diagnosis Date   History of ear infections    Past Surgical History:  Procedure Laterality Date   FEMUR IM NAIL Right 03/13/2022   Procedure: INTRAMEDULLARY (IM) NAIL FEMORAL;  Surgeon: Shona Needles, MD;  Location: [place];  Service: Orthopedics;  Laterality: Right;   MYRINGOTOMY WITH TUBE PLACEMENT Bilateral    Patient Active Problem List   Diagnosis Date Noted   Trauma 03/13/2022    ONSET DATE: 03/13/22  REFERRING DIAG:  T3 Compression, Right Femur Fracture   THERAPY DIAG:  Muscle weakness (generalized)  Rationale for Evaluation and Treatment: Rehabilitation  SUBJECTIVE:   SUBJECTIVE STATEMENT: Pt. Was present with his mother.  Pt accompanied by: self  PERTINENT HISTORY:    Pt. is a 22 y.o. male who sustained a right Femur Fracture s/p intramedullary nailing. Pt. sustained T3, and L1 compression fractures.  PRECAUTIONS: Back, TLSO when sitting, and standing  WEIGHT BEARING RESTRICTIONS: Yes  RIght TTWB  PAIN:  Are you having pain? 6/10 right thigh  FALLS: Has patient fallen in last 6 months? No  LIVING ENVIRONMENT: Lives with: lives with their family Lives in: House/apartment Stairs:  2 stories Has following equipment at home: Environmental consultant - 2 wheeled, Wheelchair (manual), shower chair, bed side commode, and Grab  bars  PLOF: Independent  Working Mudlogger: working on cars  PATIENT GOALS: To get better  OBJECTIVE:   HAND DOMINANCE: Right  ADLs:  Transfers/ambulation related to ADLs: CGA Eating:  Independent Grooming: Independent  UB Dressing: Assist set-up and steady Pt. in sitting LB Dressing: MaxA donning socks, pants, and shoes. Toileting: Modified Independent Bathing: Assist with back, and feet Tub Shower transfers: CGA  with transfer tub bench Equipment: See above  IADLs: Shopping: Has not shopped. Parents assist Light housekeeping: Assist required Parents performing Meal Prep: Family assists with meal preparation Community mobility: Relies on family, and friends Medication management: Independent after pillbox is set-up Financial management: Independent. No change Handwriting: No change noted  MOBILITY STATUS: Needs assist  POSTURE COMMENTS:   Sitting balance: TBD  ACTIVITY TOLERANCE: Activity tolerance: Able to tolerate greater than 30 min.  FUNCTIONAL OUTCOME MEASURES: 40  UPPER EXTREMITY ROM:    Active ROM Right Eval WFL Left Eval [facility]  Shoulder flexion    Shoulder abduction    Shoulder adduction    Shoulder extension    Shoulder internal rotation    Shoulder external rotation    Elbow flexion    Elbow extension    Wrist flexion    Wrist extension    Wrist ulnar deviation    Wrist radial deviation    Wrist pronation    Wrist supination    (Blank rows = not tested)  UPPER EXTREMITY MMT:  MMT Right eval Left eval  Shoulder flexion 5/5 5/5  Shoulder abduction 5/5 5/5  Shoulder adduction    Shoulder extension    Shoulder internal rotation    Shoulder external rotation    Middle trapezius    Lower trapezius    Elbow flexion 5/5 5/5  Elbow extension 5/5 5/5  Wrist flexion    Wrist extension 5/5 5/5  Wrist ulnar deviation    Wrist radial deviation    Wrist pronation    Wrist supination 5/5 5/5  (Blank rows = not  tested)  HAND FUNCTION: Grip strength: Right: 35 lbs; Left: 65 lbs, Lateral pinch: Right: 19 lbs, Left: 20 lbs, and 3 point pinch: Right: 22 lbs, Left: 19 lbs  COORDINATION: 9 Hole Peg test: Right: 22 sec; Left: 24 sec  SENSATION: WFL  EDEMA: N/A    COGNITION: Overall cognitive status: Within functional limits for tasks assessed  VISION: Intact. No change.  PERCEPTION: WFL  PRAXIS: WFL   TODAY'S TREATMENT:                                                                                                                              DATE: 03/23/22   PATIENT EDUCATION: Education details:  OT services, Treatment POC, and goals. Person educated: Patient Education method: Explanation, Demonstration, and Verbal cues Education comprehension: verbalized understanding  HOME EXERCISE PROGRAM:  Continues to assess ongoing need for a HEP.   GOALS: Goals reviewed with patient? Yes  SHORT TERM GOALS: Target date: 05/04/2022    Pt. Will be independent with HEPs. Baseline: Eval: No current HEP. Goal status: INITIAL    LONG TERM GOALS: Target date: 06/15/2022    Pt. FOTO score will improve by 2 points for perceived Pt. Improvement with assessment specific ADLs, and IADL tasks.  Baseline: Eval; FOTO: 40 Goal status: INITIAL  2.  Pt. Will perform LE dressing skills with modified independence. Baseline: Eval: MaxA Goal status: INITIAL  3.  Pt. Will perform light meal preparation with Supervision Baseline: Eval: Pt. is unable to perform light meal preparation Goal status: INITIAL  3.  Pt. Will  increase right grip strength by 10# to be able to securely hold items. Baseline: Eval: R: 35# L: 65#  Goal status: INITIAL  4.: Pt. Will reach up to retrieve cup from an overhead cabinet with modified independence.     Baseline: Eval: Pt. has difficulty reaching up into cabinetry.     Goal status: INITIAL     .ASSESSMENT:  CLINICAL IMPRESSION:  Patient is a 22  y.o. male  who was seen today for occupational therapy evaluation for right femur fracture, and T3, L1 compression fractures. Pt. presents with spinal precautions, TTWBing, and decreased right grip strength which limits his ability to complete  LE ADL , and IADL functioning. Pt.'s FOTO score is 40. Pt. will benefit from OT services to work on improving right grip strength,  IADL  functioning, and LE dressing skills  including: donning pants shoes, and socks with A/E use.  PERFORMANCE DEFICITS: in functional skills including ADLs, IADLs, and strength,  and psychosocial skills including coping strategies, environmental adaptation, and routines and behaviors.   IMPAIRMENTS: are limiting patient from ADLs, IADLs, and leisure.   CO-MORBIDITIES: may have co-morbidities  that affects occupational performance. Patient will benefit from skilled OT to address above impairments and improve overall function.  MODIFICATION OR ASSISTANCE TO COMPLETE EVALUATION: Min-Moderate modification of tasks or assist with assess necessary to complete an evaluation.  OT OCCUPATIONAL PROFILE AND HISTORY: Detailed assessment: Review of records and additional review of physical, cognitive, psychosocial history related to current functional performance.  CLINICAL DECISION MAKING: Moderate - several treatment options, min-mod task modification necessary  REHAB POTENTIAL: Good  EVALUATION COMPLEXITY: Moderate    PLAN:  OT FREQUENCY: 1x/week  OT DURATION: 12 weeks  PLANNED INTERVENTIONS: self care/ADL training, therapeutic exercise, therapeutic activity, and functional mobility training  RECOMMENDED OTHER SERVICES: PT  CONSULTED AND AGREED WITH PLAN OF CARE: Patient  PLAN FOR NEXT SESSION:  Initiate Treatment  Harrel Carina, MS, OTR/L  Harrel Carina, OT 03/23/2022, 4:29 PM

## 2022-03-24 ENCOUNTER — Other Ambulatory Visit: Payer: Self-pay | Admitting: Physician Assistant

## 2022-03-26 ENCOUNTER — Other Ambulatory Visit: Payer: Self-pay

## 2022-03-26 ENCOUNTER — Other Ambulatory Visit (HOSPITAL_COMMUNITY): Payer: Self-pay

## 2022-03-30 ENCOUNTER — Ambulatory Visit: Payer: Medicaid Other | Admitting: Occupational Therapy

## 2022-03-31 ENCOUNTER — Ambulatory Visit: Payer: Medicaid Other | Admitting: Physical Therapy

## 2022-03-31 ENCOUNTER — Ambulatory Visit: Payer: Medicaid Other | Admitting: Occupational Therapy

## 2022-03-31 DIAGNOSIS — M6281 Muscle weakness (generalized): Secondary | ICD-10-CM

## 2022-03-31 DIAGNOSIS — R2689 Other abnormalities of gait and mobility: Secondary | ICD-10-CM

## 2022-03-31 DIAGNOSIS — R2681 Unsteadiness on feet: Secondary | ICD-10-CM

## 2022-03-31 DIAGNOSIS — R262 Difficulty in walking, not elsewhere classified: Secondary | ICD-10-CM

## 2022-03-31 NOTE — Therapy (Signed)
OUTPATIENT OCCUPATIONAL THERAPY EVALUATION  Patient Name: Paul Mcfarland MRN: [id number] DOB:August 30, 2000, 22 y.o., male Today's Date: 03/31/2022  PCP: [place] PROVIDER: Alferd Apa, PA-C  END OF SESSION:  OT End of Session - 03/31/22 1444     Visit Number 2    Number of Visits 12    Date for OT Re-Evaluation 06/15/22    Authorization Type progress reporting period 03/23/2022    OT Start Time 1345    OT Stop Time 1430    OT Time Calculation (min) 45 min    Activity Tolerance Patient tolerated treatment well    Behavior During Therapy [facility] for tasks assessed/performed             Past Medical History:  Diagnosis Date   History of ear infections    Past Surgical History:  Procedure Laterality Date   FEMUR IM NAIL Right 03/13/2022   Procedure: INTRAMEDULLARY (IM) NAIL FEMORAL;  Surgeon: Shona Needles, MD;  Location: [place];  Service: Orthopedics;  Laterality: Right;   MYRINGOTOMY WITH TUBE PLACEMENT Bilateral    Patient Active Problem List   Diagnosis Date Noted   Trauma 03/13/2022    ONSET DATE: 03/13/22  REFERRING DIAG:  T3 Compression, Right Femur Fracture   THERAPY DIAG:  Muscle weakness (generalized)  Rationale for Evaluation and Treatment: Rehabilitation  SUBJECTIVE:   SUBJECTIVE STATEMENT: Pt. Was present with his mother.  Pt accompanied by: self  PERTINENT HISTORY:    Pt. is a 22 y.o. male who sustained a right Femur Fracture s/p intramedullary nailing. Pt. sustained T3, and L1 compression fractures.  PRECAUTIONS: Back, TLSO when sitting, and standing  WEIGHT BEARING RESTRICTIONS: Yes  RIght TTWB  PAIN:  Are you having pain? 4-5/10 right thigh  FALLS: Has patient fallen in last 6 months? No  LIVING ENVIRONMENT: Lives with: lives with their family Lives in: House/apartment Stairs:  2 stories Has following equipment at home: Environmental consultant - 2 wheeled, Wheelchair (manual), shower chair, bed side commode, and Grab  bars  PLOF: Independent  Working Mudlogger: working on cars  PATIENT GOALS: To get better  OBJECTIVE:   HAND DOMINANCE: Right  ADLs:  Transfers/ambulation related to ADLs: CGA Eating:  Independent Grooming: Independent  UB Dressing: Assist set-up and steady Pt. in sitting LB Dressing: MaxA donning socks, pants, and shoes. Toileting: Modified Independent Bathing: Assist with back, and feet Tub Shower transfers: CGA  with transfer tub bench Equipment: See above  IADLs: Shopping: Has not shopped. Parents assist Light housekeeping: Assist required Parents performing Meal Prep: Family assists with meal preparation Community mobility: Relies on family, and friends Medication management: Independent after pillbox is set-up Financial management: Independent. No change Handwriting: No change noted  MOBILITY STATUS: Needs assist  POSTURE COMMENTS:   Sitting balance: TBD  ACTIVITY TOLERANCE: Activity tolerance: Able to tolerate greater than 30 min.  FUNCTIONAL OUTCOME MEASURES: 40  UPPER EXTREMITY ROM:    Active ROM Right Eval WFL Left Eval [facility]  Shoulder flexion    Shoulder abduction    Shoulder adduction    Shoulder extension    Shoulder internal rotation    Shoulder external rotation    Elbow flexion    Elbow extension    Wrist flexion    Wrist extension    Wrist ulnar deviation    Wrist radial deviation    Wrist pronation    Wrist supination    (Blank rows = not tested)  UPPER EXTREMITY MMT:  MMT Right eval Left eval  Shoulder flexion 5/5 5/5  Shoulder abduction 5/5 5/5  Shoulder adduction    Shoulder extension    Shoulder internal rotation    Shoulder external rotation    Middle trapezius    Lower trapezius    Elbow flexion 5/5 5/5  Elbow extension 5/5 5/5  Wrist flexion    Wrist extension 5/5 5/5  Wrist ulnar deviation    Wrist radial deviation    Wrist pronation    Wrist supination 5/5 5/5  (Blank rows = not  tested)  HAND FUNCTION: Grip strength: Right: 35 lbs; Left: 65 lbs, Lateral pinch: Right: 19 lbs, Left: 20 lbs, and 3 point pinch: Right: 22 lbs, Left: 19 lbs  COORDINATION: 9 Hole Peg test: Right: 22 sec; Left: 24 sec  SENSATION: WFL  EDEMA: N/A    COGNITION: Overall cognitive status: Within functional limits for tasks assessed  VISION: Intact. No change.  PERCEPTION: WFL  PRAXIS: WFL   TODAY'S TREATMENT:                                                                                                                              DATE: 03/31/22   Self-care:  Pt. education was provided about A/E use for LE ADLs including: use of a reacher, and sockaide use for donning, and doffing socks, shoes, and pants. Pt. was able to return demonstration for the LLE.   Therapeutic Ex:  Pt. performed gross gripping with a gross grip strengthener. Pt. worked on sustaining grip while grasping pegs and reaching at various heights. The gripper was set to 17.9 # of grip strength for the right x's 2 trials, and 23.4# for the left  x's 1 trial resistance.  Pt. worked on green thearputty ex. For hand strengthening. Exercises including: gross gripping, gross digit extension, lateral, and 3pt. Pinch strengthening, and thumb opposition. Pt. was provided with a visual handout HEP through [place].        PATIENT EDUCATION: Education details:  hand, and grip  strengthening with theraputty HEP Person educated: Patient Education method: Explanation, Demonstration, and Verbal cues Education comprehension: verbalized understanding  HOME EXERCISE PROGRAM:  HEP for hand strengthening with theraputty.   GOALS: Goals reviewed with patient? Yes  SHORT TERM GOALS: Target date: 05/04/2022    Pt. Will be independent with HEPs. Baseline: Eval: No current HEP. Goal status: INITIAL    LONG TERM GOALS: Target date: 06/15/2022    Pt. FOTO score will improve by 2 points for perceived Pt.  Improvement with assessment specific ADLs, and IADL tasks.  Baseline: Eval; FOTO: 40 Goal status: INITIAL  2.  Pt. Will perform LE dressing skills with modified independence. Baseline: Eval: MaxA Goal status: INITIAL  3.  Pt. Will perform light meal preparation with Supervision Baseline: Eval: Pt. is unable to perform light meal preparation Goal status: INITIAL  3.  Pt. Will  increase right grip strength by 10# to be able to  securely hold items. Baseline: Eval: R: 35# L: 65#  Goal status: INITIAL  4.: Pt. Will reach up to retrieve cup from an overhead cabinet with modified independence.     Baseline: Eval: Pt. has difficulty reaching up into cabinetry.     Goal status: INITIAL     .ASSESSMENT:  CLINICAL IMPRESSION:  Pt. had a follow-up orthopedic appointment today, and has returned to the clinic with new orders to upgrade weightbearing to PWB (50%). Pt. is able to demonstrate A/E for LE dressing. Pt. was able to complete 2 trials of grip strengthening at 17.9# with the right, and 23.4# with the left for 1 trial.  Pt. was able to demonstrate proper theraputty exercises. Pt. continues to work on improving right grip strength, IADL functioning, and LE dressing skills including: donning pants shoes, and socks with A/E use.   PERFORMANCE DEFICITS: in functional skills including ADLs, IADLs, and strength,  and psychosocial skills including coping strategies, environmental adaptation, and routines and behaviors.   IMPAIRMENTS: are limiting patient from ADLs, IADLs, and leisure.   CO-MORBIDITIES: may have co-morbidities  that affects occupational performance. Patient will benefit from skilled OT to address above impairments and improve overall function.  MODIFICATION OR ASSISTANCE TO COMPLETE EVALUATION: Min-Moderate modification of tasks or assist with assess necessary to complete an evaluation.  OT OCCUPATIONAL PROFILE AND HISTORY: Detailed assessment: Review of records and additional  review of physical, cognitive, psychosocial history related to current functional performance.  CLINICAL DECISION MAKING: Moderate - several treatment options, min-mod task modification necessary  REHAB POTENTIAL: Good  EVALUATION COMPLEXITY: Moderate    PLAN:  OT FREQUENCY: 1x/week  OT DURATION: 12 weeks  PLANNED INTERVENTIONS: self care/ADL training, therapeutic exercise, therapeutic activity, and functional mobility training  RECOMMENDED OTHER SERVICES: PT  CONSULTED AND AGREED WITH PLAN OF CARE: Patient  PLAN FOR NEXT SESSION:  Continue treatment  Harrel Carina, MS, OTR/L  Harrel Carina, OT 03/31/2022, 2:46 PM
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
## 2022-04-06 ENCOUNTER — Ambulatory Visit: Payer: Medicaid Other

## 2022-04-06 ENCOUNTER — Ambulatory Visit: Payer: Medicaid Other | Attending: Physician Assistant

## 2022-04-06 DIAGNOSIS — R2681 Unsteadiness on feet: Secondary | ICD-10-CM | POA: Diagnosis present

## 2022-04-06 DIAGNOSIS — M79604 Pain in right leg: Secondary | ICD-10-CM | POA: Insufficient documentation

## 2022-04-06 DIAGNOSIS — R2689 Other abnormalities of gait and mobility: Secondary | ICD-10-CM | POA: Insufficient documentation

## 2022-04-06 DIAGNOSIS — R278 Other lack of coordination: Secondary | ICD-10-CM

## 2022-04-06 DIAGNOSIS — R262 Difficulty in walking, not elsewhere classified: Secondary | ICD-10-CM | POA: Insufficient documentation

## 2022-04-06 DIAGNOSIS — M6281 Muscle weakness (generalized): Secondary | ICD-10-CM
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
## 2022-04-08 ENCOUNTER — Encounter: Payer: Medicaid Other | Admitting: Occupational Therapy

## 2022-04-13 ENCOUNTER — Ambulatory Visit: Payer: Medicaid Other

## 2022-04-13 DIAGNOSIS — R278 Other lack of coordination: Secondary | ICD-10-CM

## 2022-04-13 DIAGNOSIS — M6281 Muscle weakness (generalized): Secondary | ICD-10-CM

## 2022-04-13 DIAGNOSIS — R2689 Other abnormalities of gait and mobility: Secondary | ICD-10-CM

## 2022-04-13 DIAGNOSIS — R262 Difficulty in walking, not elsewhere classified: Secondary | ICD-10-CM

## 2022-04-13 DIAGNOSIS — R2681 Unsteadiness on feet: Secondary | ICD-10-CM

## 2022-04-13 DIAGNOSIS — M79604 Pain in right leg: Secondary | ICD-10-CM
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
## 2022-04-20 ENCOUNTER — Ambulatory Visit: Payer: Medicaid Other

## 2022-04-20 DIAGNOSIS — M6281 Muscle weakness (generalized): Secondary | ICD-10-CM | POA: Diagnosis not present

## 2022-04-20 DIAGNOSIS — R2681 Unsteadiness on feet: Secondary | ICD-10-CM

## 2022-04-20 DIAGNOSIS — R278 Other lack of coordination: Secondary | ICD-10-CM

## 2022-04-20 DIAGNOSIS — M79604 Pain in right leg: Secondary | ICD-10-CM

## 2022-04-20 DIAGNOSIS — R2689 Other abnormalities of gait and mobility: Secondary | ICD-10-CM

## 2022-04-20 DIAGNOSIS — R262 Difficulty in walking, not elsewhere classified: Secondary | ICD-10-CM
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
## 2022-04-23 ENCOUNTER — Encounter: Payer: Medicaid Other | Admitting: Occupational Therapy

## 2022-04-27 ENCOUNTER — Ambulatory Visit: Payer: Medicaid Other

## 2022-04-27 DIAGNOSIS — M79604 Pain in right leg: Secondary | ICD-10-CM

## 2022-04-27 DIAGNOSIS — R2681 Unsteadiness on feet: Secondary | ICD-10-CM

## 2022-04-27 DIAGNOSIS — M6281 Muscle weakness (generalized): Secondary | ICD-10-CM | POA: Diagnosis not present

## 2022-04-27 DIAGNOSIS — R262 Difficulty in walking, not elsewhere classified: Secondary | ICD-10-CM

## 2022-04-27 DIAGNOSIS — R2689 Other abnormalities of gait and mobility: Secondary | ICD-10-CM
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
## 2022-04-28 ENCOUNTER — Encounter: Payer: Medicaid Other | Admitting: Occupational Therapy

## 2022-04-28 ENCOUNTER — Ambulatory Visit: Payer: Medicaid Other | Admitting: Physical Therapy

## 2022-04-30 ENCOUNTER — Ambulatory Visit: Payer: Medicaid Other | Admitting: Physical Therapy

## 2022-05-04 ENCOUNTER — Ambulatory Visit: Payer: Medicaid Other

## 2022-05-04 DIAGNOSIS — R278 Other lack of coordination: Secondary | ICD-10-CM

## 2022-05-04 DIAGNOSIS — R2681 Unsteadiness on feet: Secondary | ICD-10-CM

## 2022-05-04 DIAGNOSIS — M6281 Muscle weakness (generalized): Secondary | ICD-10-CM

## 2022-05-04 DIAGNOSIS — R262 Difficulty in walking, not elsewhere classified: Secondary | ICD-10-CM

## 2022-05-04 DIAGNOSIS — R2689 Other abnormalities of gait and mobility: Secondary | ICD-10-CM

## 2022-05-04 DIAGNOSIS — M79604 Pain in right leg: Secondary | ICD-10-CM
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
## 2022-05-06 ENCOUNTER — Encounter: Payer: Medicaid Other | Admitting: Occupational Therapy

## 2022-05-06 ENCOUNTER — Ambulatory Visit: Payer: Medicaid Other

## 2022-05-12 ENCOUNTER — Ambulatory Visit: Payer: Medicaid Other | Attending: Physician Assistant | Admitting: Physical Therapy

## 2022-05-12 ENCOUNTER — Encounter: Payer: Medicaid Other | Admitting: Occupational Therapy

## 2022-05-12 DIAGNOSIS — M6281 Muscle weakness (generalized): Secondary | ICD-10-CM | POA: Diagnosis present

## 2022-05-12 DIAGNOSIS — R2681 Unsteadiness on feet: Secondary | ICD-10-CM | POA: Diagnosis present

## 2022-05-12 DIAGNOSIS — R278 Other lack of coordination: Secondary | ICD-10-CM | POA: Diagnosis present

## 2022-05-12 DIAGNOSIS — R2689 Other abnormalities of gait and mobility: Secondary | ICD-10-CM | POA: Diagnosis present

## 2022-05-12 DIAGNOSIS — R262 Difficulty in walking, not elsewhere classified: Secondary | ICD-10-CM | POA: Diagnosis present

## 2022-05-12 DIAGNOSIS — M79604 Pain in right leg: Secondary | ICD-10-CM | POA: Diagnosis present
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
## 2022-05-14 ENCOUNTER — Encounter: Payer: Medicaid Other | Admitting: Occupational Therapy

## 2022-05-14 ENCOUNTER — Ambulatory Visit: Payer: Medicaid Other | Admitting: Physical Therapy

## 2022-05-21 ENCOUNTER — Ambulatory Visit: Payer: Medicaid Other | Admitting: Physical Therapy

## 2022-05-21 DIAGNOSIS — R262 Difficulty in walking, not elsewhere classified: Secondary | ICD-10-CM

## 2022-05-21 DIAGNOSIS — M79604 Pain in right leg: Secondary | ICD-10-CM

## 2022-05-21 DIAGNOSIS — R278 Other lack of coordination: Secondary | ICD-10-CM

## 2022-05-21 DIAGNOSIS — R2681 Unsteadiness on feet: Secondary | ICD-10-CM

## 2022-05-21 DIAGNOSIS — R2689 Other abnormalities of gait and mobility: Secondary | ICD-10-CM

## 2022-05-21 DIAGNOSIS — M6281 Muscle weakness (generalized): Secondary | ICD-10-CM
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
## 2022-05-26 ENCOUNTER — Ambulatory Visit: Payer: Medicaid Other | Admitting: Physical Therapy

## 2022-05-26 ENCOUNTER — Encounter: Payer: Medicaid Other | Admitting: Occupational Therapy

## 2022-05-26 DIAGNOSIS — R278 Other lack of coordination: Secondary | ICD-10-CM

## 2022-05-26 DIAGNOSIS — R262 Difficulty in walking, not elsewhere classified: Secondary | ICD-10-CM

## 2022-05-26 DIAGNOSIS — M6281 Muscle weakness (generalized): Secondary | ICD-10-CM

## 2022-05-26 DIAGNOSIS — M79604 Pain in right leg: Secondary | ICD-10-CM

## 2022-05-26 DIAGNOSIS — R2689 Other abnormalities of gait and mobility: Secondary | ICD-10-CM | POA: Diagnosis not present

## 2022-05-26 DIAGNOSIS — R2681 Unsteadiness on feet: Secondary | ICD-10-CM
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
## 2022-06-02 ENCOUNTER — Ambulatory Visit: Payer: Medicaid Other | Admitting: Physical Therapy

## 2022-06-02 ENCOUNTER — Encounter: Payer: Medicaid Other | Admitting: Occupational Therapy

## 2022-06-02 DIAGNOSIS — R262 Difficulty in walking, not elsewhere classified: Secondary | ICD-10-CM

## 2022-06-02 DIAGNOSIS — R278 Other lack of coordination: Secondary | ICD-10-CM

## 2022-06-02 DIAGNOSIS — M79604 Pain in right leg: Secondary | ICD-10-CM

## 2022-06-02 DIAGNOSIS — R2689 Other abnormalities of gait and mobility: Secondary | ICD-10-CM | POA: Diagnosis not present

## 2022-06-02 DIAGNOSIS — M6281 Muscle weakness (generalized): Secondary | ICD-10-CM

## 2022-06-02 DIAGNOSIS — R2681 Unsteadiness on feet: Secondary | ICD-10-CM
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
## 2022-06-03 NOTE — Addendum Note (Signed)
Addended by: Golden Pop on: 06/03/2022 11:16 AM   Modules accepted: Orders

## 2022-06-08 ENCOUNTER — Ambulatory Visit: Payer: Medicaid Other

## 2022-06-08 ENCOUNTER — Ambulatory Visit: Payer: Medicaid Other | Attending: Physician Assistant

## 2022-06-08 DIAGNOSIS — R2681 Unsteadiness on feet: Secondary | ICD-10-CM | POA: Diagnosis present

## 2022-06-08 DIAGNOSIS — M6281 Muscle weakness (generalized): Secondary | ICD-10-CM | POA: Diagnosis present

## 2022-06-08 DIAGNOSIS — R278 Other lack of coordination: Secondary | ICD-10-CM

## 2022-06-08 DIAGNOSIS — R262 Difficulty in walking, not elsewhere classified: Secondary | ICD-10-CM

## 2022-06-08 DIAGNOSIS — R2689 Other abnormalities of gait and mobility: Secondary | ICD-10-CM

## 2022-06-08 DIAGNOSIS — M79604 Pain in right leg: Secondary | ICD-10-CM
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
## 2022-06-16 ENCOUNTER — Ambulatory Visit: Payer: Medicaid Other | Admitting: Physical Therapy

## 2022-06-16 DIAGNOSIS — R2689 Other abnormalities of gait and mobility: Secondary | ICD-10-CM

## 2022-06-16 DIAGNOSIS — R2681 Unsteadiness on feet: Secondary | ICD-10-CM

## 2022-06-16 DIAGNOSIS — M6281 Muscle weakness (generalized): Secondary | ICD-10-CM

## 2022-06-16 DIAGNOSIS — M79604 Pain in right leg: Secondary | ICD-10-CM

## 2022-06-16 DIAGNOSIS — R262 Difficulty in walking, not elsewhere classified: Secondary | ICD-10-CM

## 2022-06-16 DIAGNOSIS — R278 Other lack of coordination: Secondary | ICD-10-CM
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
## 2022-06-22 ENCOUNTER — Ambulatory Visit: Payer: Medicaid Other

## 2022-06-22 DIAGNOSIS — M6281 Muscle weakness (generalized): Secondary | ICD-10-CM

## 2022-06-22 DIAGNOSIS — M79604 Pain in right leg: Secondary | ICD-10-CM

## 2022-06-22 DIAGNOSIS — R262 Difficulty in walking, not elsewhere classified: Secondary | ICD-10-CM

## 2022-06-22 DIAGNOSIS — R278 Other lack of coordination: Secondary | ICD-10-CM

## 2022-06-22 DIAGNOSIS — R2689 Other abnormalities of gait and mobility: Secondary | ICD-10-CM

## 2022-06-22 DIAGNOSIS — R2681 Unsteadiness on feet: Secondary | ICD-10-CM
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
## 2022-06-29 ENCOUNTER — Ambulatory Visit: Payer: Medicaid Other

## 2022-06-29 ENCOUNTER — Encounter: Payer: Medicaid Other | Admitting: Occupational Therapy

## 2022-06-29 DIAGNOSIS — R2689 Other abnormalities of gait and mobility: Secondary | ICD-10-CM

## 2022-06-29 DIAGNOSIS — R278 Other lack of coordination: Secondary | ICD-10-CM

## 2022-06-29 DIAGNOSIS — M6281 Muscle weakness (generalized): Secondary | ICD-10-CM

## 2022-06-29 DIAGNOSIS — R262 Difficulty in walking, not elsewhere classified: Secondary | ICD-10-CM

## 2022-06-29 DIAGNOSIS — M79604 Pain in right leg: Secondary | ICD-10-CM

## 2022-06-29 DIAGNOSIS — R2681 Unsteadiness on feet: Secondary | ICD-10-CM
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
## 2022-07-06 ENCOUNTER — Ambulatory Visit: Payer: Medicaid Other | Attending: Physician Assistant | Admitting: Physical Therapy

## 2022-07-06 DIAGNOSIS — R2689 Other abnormalities of gait and mobility: Secondary | ICD-10-CM | POA: Diagnosis present

## 2022-07-06 DIAGNOSIS — R262 Difficulty in walking, not elsewhere classified: Secondary | ICD-10-CM | POA: Insufficient documentation

## 2022-07-06 DIAGNOSIS — R2681 Unsteadiness on feet: Secondary | ICD-10-CM | POA: Diagnosis present

## 2022-07-06 DIAGNOSIS — M6281 Muscle weakness (generalized): Secondary | ICD-10-CM | POA: Diagnosis present
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
## 2022-07-13 ENCOUNTER — Ambulatory Visit: Payer: Medicaid Other | Admitting: Physical Therapy

## 2022-07-20 ENCOUNTER — Ambulatory Visit: Payer: Medicaid Other | Admitting: Physical Therapy

## 2022-07-20 ENCOUNTER — Telehealth: Payer: Self-pay | Admitting: Physical Therapy

## 2022-07-20 NOTE — Telephone Encounter (Signed)
Called pt to notify them they are being discharged from therapy on this day due to overall progress, pt instructed to call back if they had concerns or wanted to continue therapy.  Thresa Ross PT, DPT

## 2022-07-27 ENCOUNTER — Ambulatory Visit: Payer: Medicaid Other | Admitting: Physical Therapy

## 2022-08-03 ENCOUNTER — Ambulatory Visit: Payer: Medicaid Other | Admitting: Physical Therapy

## 2022-08-10 ENCOUNTER — Encounter: Payer: Medicaid Other | Admitting: Physical Therapy

## 2022-08-17 ENCOUNTER — Encounter: Payer: Medicaid Other | Admitting: Physical Therapy

## 2022-09-24 ENCOUNTER — Other Ambulatory Visit (HOSPITAL_COMMUNITY): Payer: Self-pay
